# Patient Record
Sex: Female | Born: 1965
Health system: Southern US, Community
[De-identification: ages and names within clinical notes are randomized; demographics above are authoritative.]

---

## 2013-09-10 ENCOUNTER — Other Ambulatory Visit (HOSPITAL_COMMUNITY)
Admission: RE | Admit: 2013-09-10 | Discharge: 2013-09-10 | Disposition: A | Discharge status: Home / Self Care | Payer: BC Managed Care – PPO | Source: Ambulatory Visit | Attending: Family Medicine | Admitting: Family Medicine

## 2013-09-10 DIAGNOSIS — Z124 Encounter for screening for malignant neoplasm of cervix: Secondary | ICD-10-CM | POA: Insufficient documentation

## 2013-09-10 DIAGNOSIS — Z1151 Encounter for screening for human papillomavirus (HPV): Secondary | ICD-10-CM | POA: Insufficient documentation

## 2014-05-03 ENCOUNTER — Other Ambulatory Visit: Payer: Self-pay | Admitting: Family Medicine

## 2014-05-03 DIAGNOSIS — E049 Nontoxic goiter, unspecified: Secondary | ICD-10-CM

## 2014-05-03 DIAGNOSIS — M542 Cervicalgia: Secondary | ICD-10-CM

## 2014-05-03 DIAGNOSIS — R1310 Dysphagia, unspecified: Secondary | ICD-10-CM

## 2014-05-05 ENCOUNTER — Ambulatory Visit
Admission: RE | Admit: 2014-05-05 | Discharge: 2014-05-05 | Disposition: A | Discharge status: Home / Self Care | Payer: BLUE CROSS/BLUE SHIELD | Source: Ambulatory Visit | Attending: Family Medicine | Admitting: Family Medicine

## 2014-05-05 DIAGNOSIS — E049 Nontoxic goiter, unspecified: Secondary | ICD-10-CM

## 2014-05-05 DIAGNOSIS — M542 Cervicalgia: Secondary | ICD-10-CM

## 2014-05-05 DIAGNOSIS — R1310 Dysphagia, unspecified: Secondary | ICD-10-CM

## 2015-10-10 DIAGNOSIS — N63 Unspecified lump in breast: Secondary | ICD-10-CM | POA: Diagnosis not present

## 2015-10-12 DIAGNOSIS — N63 Unspecified lump in breast: Secondary | ICD-10-CM | POA: Diagnosis not present

## 2015-10-16 ENCOUNTER — Other Ambulatory Visit: Payer: Self-pay | Admitting: Radiology

## 2015-10-16 DIAGNOSIS — N63 Unspecified lump in breast: Secondary | ICD-10-CM | POA: Diagnosis not present

## 2015-10-16 DIAGNOSIS — N6012 Diffuse cystic mastopathy of left breast: Secondary | ICD-10-CM | POA: Diagnosis not present

## 2015-10-27 DIAGNOSIS — M542 Cervicalgia: Secondary | ICD-10-CM | POA: Diagnosis not present

## 2015-11-29 DIAGNOSIS — Z23 Encounter for immunization: Secondary | ICD-10-CM | POA: Diagnosis not present

## 2016-01-23 DIAGNOSIS — Z0000 Encounter for general adult medical examination without abnormal findings: Secondary | ICD-10-CM | POA: Diagnosis not present

## 2016-01-23 DIAGNOSIS — Z1159 Encounter for screening for other viral diseases: Secondary | ICD-10-CM | POA: Diagnosis not present

## 2016-01-31 DIAGNOSIS — Z0000 Encounter for general adult medical examination without abnormal findings: Secondary | ICD-10-CM | POA: Diagnosis not present

## 2016-02-12 DIAGNOSIS — F411 Generalized anxiety disorder: Secondary | ICD-10-CM | POA: Diagnosis not present

## 2016-02-21 DIAGNOSIS — F411 Generalized anxiety disorder: Secondary | ICD-10-CM | POA: Diagnosis not present

## 2016-02-28 DIAGNOSIS — F411 Generalized anxiety disorder: Secondary | ICD-10-CM | POA: Diagnosis not present

## 2016-03-05 DIAGNOSIS — F411 Generalized anxiety disorder: Secondary | ICD-10-CM | POA: Diagnosis not present

## 2016-03-26 DIAGNOSIS — I781 Nevus, non-neoplastic: Secondary | ICD-10-CM | POA: Diagnosis not present

## 2016-03-26 DIAGNOSIS — L509 Urticaria, unspecified: Secondary | ICD-10-CM | POA: Diagnosis not present

## 2016-04-08 DIAGNOSIS — Z114 Encounter for screening for human immunodeficiency virus [HIV]: Secondary | ICD-10-CM | POA: Diagnosis not present

## 2016-04-08 DIAGNOSIS — Z1159 Encounter for screening for other viral diseases: Secondary | ICD-10-CM | POA: Diagnosis not present

## 2016-04-08 DIAGNOSIS — Z6823 Body mass index (BMI) 23.0-23.9, adult: Secondary | ICD-10-CM | POA: Diagnosis not present

## 2016-04-08 DIAGNOSIS — R102 Pelvic and perineal pain: Secondary | ICD-10-CM | POA: Diagnosis not present

## 2016-04-08 DIAGNOSIS — B009 Herpesviral infection, unspecified: Secondary | ICD-10-CM | POA: Diagnosis not present

## 2016-04-08 DIAGNOSIS — Z1151 Encounter for screening for human papillomavirus (HPV): Secondary | ICD-10-CM | POA: Diagnosis not present

## 2016-04-08 DIAGNOSIS — Z113 Encounter for screening for infections with a predominantly sexual mode of transmission: Secondary | ICD-10-CM | POA: Diagnosis not present

## 2016-04-08 DIAGNOSIS — Z01411 Encounter for gynecological examination (general) (routine) with abnormal findings: Secondary | ICD-10-CM | POA: Diagnosis not present

## 2016-04-08 DIAGNOSIS — N9411 Superficial (introital) dyspareunia: Secondary | ICD-10-CM | POA: Diagnosis not present

## 2016-04-23 DIAGNOSIS — R928 Other abnormal and inconclusive findings on diagnostic imaging of breast: Secondary | ICD-10-CM | POA: Diagnosis not present

## 2016-04-23 DIAGNOSIS — N6019 Diffuse cystic mastopathy of unspecified breast: Secondary | ICD-10-CM | POA: Diagnosis not present

## 2016-06-20 DIAGNOSIS — N9411 Superficial (introital) dyspareunia: Secondary | ICD-10-CM | POA: Diagnosis not present

## 2016-11-11 DIAGNOSIS — Z1231 Encounter for screening mammogram for malignant neoplasm of breast: Secondary | ICD-10-CM | POA: Diagnosis not present

## 2016-11-28 DIAGNOSIS — Z23 Encounter for immunization: Secondary | ICD-10-CM | POA: Diagnosis not present

## 2017-01-31 DIAGNOSIS — Z23 Encounter for immunization: Secondary | ICD-10-CM | POA: Diagnosis not present

## 2017-01-31 DIAGNOSIS — Z0000 Encounter for general adult medical examination without abnormal findings: Secondary | ICD-10-CM | POA: Diagnosis not present

## 2017-03-29 DIAGNOSIS — J209 Acute bronchitis, unspecified: Secondary | ICD-10-CM | POA: Diagnosis not present

## 2017-03-29 DIAGNOSIS — J22 Unspecified acute lower respiratory infection: Secondary | ICD-10-CM | POA: Diagnosis not present

## 2017-03-29 DIAGNOSIS — R05 Cough: Secondary | ICD-10-CM | POA: Diagnosis not present

## 2017-08-01 DIAGNOSIS — Z01411 Encounter for gynecological examination (general) (routine) with abnormal findings: Secondary | ICD-10-CM | POA: Diagnosis not present

## 2017-08-01 DIAGNOSIS — Z118 Encounter for screening for other infectious and parasitic diseases: Secondary | ICD-10-CM | POA: Diagnosis not present

## 2017-08-01 DIAGNOSIS — N898 Other specified noninflammatory disorders of vagina: Secondary | ICD-10-CM | POA: Diagnosis not present

## 2017-08-01 DIAGNOSIS — N982 Complications of attempted introduction of fertilized ovum following in vitro fertilization: Secondary | ICD-10-CM | POA: Diagnosis not present

## 2017-08-01 DIAGNOSIS — N952 Postmenopausal atrophic vaginitis: Secondary | ICD-10-CM | POA: Diagnosis not present

## 2017-08-01 DIAGNOSIS — Z6823 Body mass index (BMI) 23.0-23.9, adult: Secondary | ICD-10-CM | POA: Diagnosis not present

## 2017-11-13 DIAGNOSIS — Z1231 Encounter for screening mammogram for malignant neoplasm of breast: Secondary | ICD-10-CM | POA: Diagnosis not present

## 2018-02-17 DIAGNOSIS — Z13220 Encounter for screening for lipoid disorders: Secondary | ICD-10-CM | POA: Diagnosis not present

## 2018-02-17 DIAGNOSIS — Z1211 Encounter for screening for malignant neoplasm of colon: Secondary | ICD-10-CM | POA: Diagnosis not present

## 2018-02-17 DIAGNOSIS — Z0000 Encounter for general adult medical examination without abnormal findings: Secondary | ICD-10-CM | POA: Diagnosis not present

## 2018-04-20 ENCOUNTER — Emergency Department (HOSPITAL_COMMUNITY)
Admission: EM | Admit: 2018-04-20 | Discharge: 2018-04-20 | Disposition: A | Discharge status: Home / Self Care | Payer: BLUE CROSS/BLUE SHIELD | Attending: Emergency Medicine | Admitting: Emergency Medicine

## 2018-04-20 ENCOUNTER — Encounter (HOSPITAL_COMMUNITY): Payer: Self-pay | Admitting: *Deleted

## 2018-04-20 ENCOUNTER — Emergency Department (HOSPITAL_COMMUNITY): Payer: BLUE CROSS/BLUE SHIELD

## 2018-04-20 DIAGNOSIS — M25551 Pain in right hip: Secondary | ICD-10-CM | POA: Diagnosis not present

## 2018-04-20 DIAGNOSIS — I10 Essential (primary) hypertension: Secondary | ICD-10-CM | POA: Diagnosis not present

## 2018-04-20 DIAGNOSIS — R52 Pain, unspecified: Secondary | ICD-10-CM | POA: Diagnosis not present

## 2018-04-20 DIAGNOSIS — M5416 Radiculopathy, lumbar region: Secondary | ICD-10-CM | POA: Diagnosis not present

## 2018-04-20 DIAGNOSIS — G5701 Lesion of sciatic nerve, right lower limb: Secondary | ICD-10-CM | POA: Diagnosis not present

## 2018-04-20 DIAGNOSIS — R531 Weakness: Secondary | ICD-10-CM | POA: Diagnosis not present

## 2018-04-20 DIAGNOSIS — M545 Low back pain: Secondary | ICD-10-CM | POA: Diagnosis not present

## 2018-04-20 DIAGNOSIS — Z79899 Other long term (current) drug therapy: Secondary | ICD-10-CM | POA: Insufficient documentation

## 2018-04-20 DIAGNOSIS — M5410 Radiculopathy, site unspecified: Secondary | ICD-10-CM

## 2018-04-20 MED ORDER — DIAZEPAM 5 MG PO TABS
5.0000 mg | ORAL_TABLET | Freq: Once | ORAL | Status: AC
  Administered 2018-04-20: 5 mg via ORAL
  Filled 2018-04-20: qty 1

## 2018-04-20 MED ORDER — MORPHINE SULFATE 15 MG PO TABS: 15.0000 mg | ORAL_TABLET | ORAL | 0 refills | Status: AC | PRN

## 2018-04-20 MED ORDER — KETOROLAC TROMETHAMINE 60 MG/2ML IM SOLN
15.0000 mg | Freq: Once | INTRAMUSCULAR | Status: AC
  Administered 2018-04-20: 15 mg via INTRAMUSCULAR
  Filled 2018-04-20: qty 2

## 2018-04-20 MED ORDER — MORPHINE SULFATE 15 MG PO TABS: 15.0000 mg | ORAL_TABLET | ORAL | 0 refills | Status: DC | PRN

## 2018-04-20 MED ORDER — DIAZEPAM 5 MG PO TABS: 5.0000 mg | ORAL_TABLET | Freq: Four times a day (QID) | ORAL | 0 refills | Status: AC | PRN

## 2018-04-20 MED ORDER — ACETAMINOPHEN 500 MG PO TABS
1000.0000 mg | ORAL_TABLET | Freq: Once | ORAL | Status: AC
  Administered 2018-04-20: 1000 mg via ORAL
  Filled 2018-04-20: qty 2

## 2018-04-20 MED ORDER — MORPHINE SULFATE (PF) 4 MG/ML IV SOLN
4.0000 mg | Freq: Once | INTRAVENOUS | Status: AC
  Administered 2018-04-20: 4 mg via INTRAMUSCULAR
  Filled 2018-04-20: qty 1

## 2018-04-20 NOTE — Discharge Instructions (Signed)
Take 4 over the counter ibuprofen tablets 3 times a day or 2 over-the-counter naproxen tablets twice a day for pain. Also take tylenol 1000mg (2 extra strength) four times a day.   Then take the pain medicine if you feel like you need it. Narcotics do not help with the pain, they only make you care about it less.  You can become addicted to this, people may break into your house to steal it.  It will constipate you.  If you drive under the influence of this medicine you can get a DUI.    Return for fever sudden worsening pain numbness or weakness to the leg difficulty urinating or inability to urinate if you poop on yourself or if he cannot feel your bottom when you wipe with toilet paper.

## 2018-04-20 NOTE — ED Provider Notes (Signed)
[facility]-EMERGENCY DEPT Provider Note   CSN: [id number] Arrival date & time: 04/20/18  0911     History   Chief Complaint Chief Complaint  Patient presents with  . Hip Pain    HPI Jill Gilbert is a 53 y.o. female.  52 yo F with a chief complaint of right leg pain that radiates from the buttock down to the anterior aspect of the shin.  Describes this as a crampy pain.  Worse with sitting and standing.  To the point where she is unable to stand or walk.  She denies fevers or chills denies trauma.  She did personal trainer session a day before the onset of the pain.  She denies loss of bowel or bladder denies loss perirectal sensation denies abdominal pain.  The history is provided by the patient.  Hip Pain  This is a new problem. The current episode started less than 1 hour ago. The problem occurs constantly. The problem has not changed since onset.Pertinent negatives include no chest pain, no headaches and no shortness of breath. Nothing aggravates the symptoms. Nothing relieves the symptoms. She has tried nothing for the symptoms. The treatment provided no relief.    History reviewed. No pertinent past medical history.  There are no active problems to display for this patient.   History reviewed. No pertinent surgical history.   OB History   No obstetric history on file.      Home Medications    Prior to Admission medications   Medication Sig Start Date End Date Taking? Authorizing Provider  b complex vitamins tablet Take 1 tablet by mouth daily.   Yes [provider]  cetirizine (ZYRTEC) 10 MG tablet Take 10 mg by mouth daily.   Yes [provider]  Misc Natural Products (TART CHERRY ADVANCED PO) Take 1 tablet by mouth daily.   Yes [provider]  naproxen sodium (ALEVE) 220 MG tablet Take 220 mg by mouth daily as needed (pain).   Yes [provider]  TURMERIC PO Take 1 capsule by mouth daily.   Yes  [provider]  Vitamin D, Cholecalciferol, 50 MCG (2000 UT) CAPS Take 1 tablet by mouth daily.   Yes [provider]  diazepam (VALIUM) 5 MG tablet Take 1 tablet (5 mg total) by mouth every 6 (six) hours as needed for anxiety (spasms). 04/20/18   Melene Plan, DO  morphine (MSIR) 15 MG tablet Take 1 tablet (15 mg total) by mouth every 4 (four) hours as needed for severe pain. 04/20/18   Melene Plan, DO    Family History No family history on file.  Social History Social History   Tobacco Use  . Smoking status: Never Smoker  . Smokeless tobacco: Never Used  Substance Use Topics  . Alcohol use: Yes  . Drug use: Not on file     Allergies   Sulfa antibiotics and Vistaril [hydroxyzine hcl]   Review of Systems Review of Systems  Constitutional: Negative for chills and fever.  HENT: Negative for congestion and rhinorrhea.   Eyes: Negative for redness and visual disturbance.  Respiratory: Negative for shortness of breath and wheezing.   Cardiovascular: Negative for chest pain and palpitations.  Gastrointestinal: Negative for nausea and vomiting.  Genitourinary: Negative for dysuria and urgency.  Musculoskeletal: Positive for arthralgias, back pain and myalgias.  Skin: Negative for pallor and wound.  Neurological: Negative for dizziness and headaches.     Physical Exam Updated Vital Signs BP (!) 147/83 (BP  Location: Right Arm)   Pulse 69   Temp 97.9 F (36.6 C) (Oral)   Resp 18   SpO2 100%   Physical Exam Vitals signs and nursing note reviewed.  Constitutional:      General: She is not in acute distress.    Appearance: She is well-developed. She is not diaphoretic.  HENT:     Head: Normocephalic and atraumatic.  Eyes:     Pupils: Pupils are equal, round, and reactive to light.  Neck:     Musculoskeletal: Normal range of motion and neck supple.  Cardiovascular:     Rate and Rhythm: Normal rate and regular rhythm.     Heart sounds: No murmur. No  friction rub. No gallop.   Pulmonary:     Effort: Pulmonary effort is normal.     Breath sounds: No wheezing or rales.  Abdominal:     General: There is no distension.     Palpations: Abdomen is soft.     Tenderness: There is abdominal tenderness (suprapubic, and diffuse lower).     Comments: Palpable bladder  Musculoskeletal:        General: Tenderness present.     Comments: Tenderness about the right piriformis.  No midline spinal tenderness.  Pulse motor and sensation is intact distally.  No noted clonus, reflexes are 2+ and equal.  Skin:    General: Skin is warm and dry.  Neurological:     Mental Status: She is alert and oriented to person, place, and time.  Psychiatric:        Behavior: Behavior normal.      ED Treatments / Results  Labs (all labs ordered are listed, but only abnormal results are displayed) Labs Reviewed - No data to display  EKG None  Radiology Dg Lumbar Spine Complete  Result Date: 04/20/2018 CLINICAL DATA:  Intermittent low back pain with leg weakness since working out 4 days ago. EXAM: LUMBAR SPINE - COMPLETE 4+ VIEW COMPARISON:  None. FINDINGS: Vertebral body alignment, heights and disc space heights are normal. Minimal spondylosis of the lumbar spine. No evidence of compression fracture or spondylolisthesis. IMPRESSION: No acute findings. Minimal spondylosis. Electronically Signed   By: Elberta Fortis M.D.   On: 04/20/2018 10:55   Dg Hip Unilat W Or Wo Pelvis 2-3 Views Right  Result Date: 04/20/2018 CLINICAL DATA:  Intermittent low back pain and leg weakness is working out 4 days ago. EXAM: DG HIP (WITH OR WITHOUT PELVIS) 2-3V RIGHT COMPARISON:  None. FINDINGS: Bones, joint spaces and soft tissues of the hips are normal. No evidence of fracture or dislocation. No significant degenerative change. IMPRESSION: No acute findings. Electronically Signed   By: Elberta Fortis M.D.   On: 04/20/2018 10:56    Procedures Procedures (including critical care  time)  Medications Ordered in ED Medications  morphine 4 MG/ML injection 4 mg (4 mg Intramuscular Given 04/20/18 1017)  ketorolac (TORADOL) injection 15 mg (15 mg Intramuscular Given 04/20/18 1013)  acetaminophen (TYLENOL) tablet 1,000 mg (1,000 mg Oral Given 04/20/18 1013)  diazepam (VALIUM) tablet 5 mg (5 mg Oral Given 04/20/18 1013)     Initial Impression / Assessment and Plan / ED Course  I have reviewed the triage vital signs and the nursing notes.  Pertinent labs & imaging results that were available during my care of the patient were reviewed by me and considered in my medical decision making (see chart for details).     53 yo F with a chief complaint of right-sided  low back pain.  Clinically the patient has piriformis syndrome, she however is in a severe amount of pain.  We will try and treat her pain here screening plain films.  She is having some lower abdominal tenderness that she states is due to her needing to use the bathroom.  She denies any issues actually going but has been having so much pain is hard for her to get up and onto a toilet.  Screening plain films are negative for fracture as viewed by me.  Patient is feeling better on reassessment and she is able to ambulate so on her own.  Still having a significant amount of pain.  We will have the patient use Tylenol and ibuprofen will send home with a short course of pain medicine and muscle relaxants, PCP follow-up. [place] drug database without prior narcotic script.   11:29 AM:  I have discussed the diagnosis/risks/treatment options with the patient and family and believe the pt to be eligible for discharge home to follow-up with PCP. We also discussed returning to the ED immediately if new or worsening sx occur. We discussed the sx which are most concerning (e.g., sudden worsening pain, fever, cauda equina s/sx) that necessitate immediate return. Medications administered to the patient during their visit and any new prescriptions  provided to the patient are listed below.  Medications given during this visit Medications  morphine 4 MG/ML injection 4 mg (4 mg Intramuscular Given 04/20/18 1017)  ketorolac (TORADOL) injection 15 mg (15 mg Intramuscular Given 04/20/18 1013)  acetaminophen (TYLENOL) tablet 1,000 mg (1,000 mg Oral Given 04/20/18 1013)  diazepam (VALIUM) tablet 5 mg (5 mg Oral Given 04/20/18 1013)     The patient appears reasonably screen and/or stabilized for discharge and I doubt any other medical condition or other [facility] requiring further screening, evaluation, or treatment in the ED at this time prior to discharge.    Final Clinical Impressions(s) / ED Diagnoses   Final diagnoses:  Radiculopathy of leg  Piriformis syndrome of right side    ED Discharge Orders         Ordered    morphine (MSIR) 15 MG tablet  Every 4 hours PRN     04/20/18 1127    diazepam (VALIUM) 5 MG tablet  Every 6 hours PRN     04/20/18 1127           Melene PlanFloyd, Denzal Meir, DO 04/20/18 1129

## 2018-04-20 NOTE — ED Notes (Signed)
Bed: WA21 Expected date:  Expected time:  Means of arrival:  Comments: EMS  

## 2018-04-20 NOTE — ED Triage Notes (Signed)
Per EMS, pt complains of worsening right hip pain x 2 days. Pt states she was unable to walk since yesterday. Pt has been doing strenuous workouts with a trainer recently. Pt denies injury.  BP 140/80 HR 80 SpO2 100% RR 18

## 2018-04-23 DIAGNOSIS — M5416 Radiculopathy, lumbar region: Secondary | ICD-10-CM | POA: Diagnosis not present

## 2018-05-02 DIAGNOSIS — G64 Other disorders of peripheral nervous system: Secondary | ICD-10-CM | POA: Diagnosis not present

## 2018-05-02 DIAGNOSIS — M7631 Iliotibial band syndrome, right leg: Secondary | ICD-10-CM | POA: Diagnosis not present

## 2018-05-02 DIAGNOSIS — M545 Low back pain: Secondary | ICD-10-CM | POA: Diagnosis not present

## 2018-07-20 DIAGNOSIS — Z03818 Encounter for observation for suspected exposure to other biological agents ruled out: Secondary | ICD-10-CM | POA: Diagnosis not present

## 2018-10-01 DIAGNOSIS — Z20828 Contact with and (suspected) exposure to other viral communicable diseases: Secondary | ICD-10-CM | POA: Diagnosis not present

## 2018-11-10 DIAGNOSIS — Z6824 Body mass index (BMI) 24.0-24.9, adult: Secondary | ICD-10-CM | POA: Diagnosis not present

## 2018-11-10 DIAGNOSIS — N898 Other specified noninflammatory disorders of vagina: Secondary | ICD-10-CM | POA: Diagnosis not present

## 2018-11-10 DIAGNOSIS — Z01419 Encounter for gynecological examination (general) (routine) without abnormal findings: Secondary | ICD-10-CM | POA: Diagnosis not present

## 2018-11-10 DIAGNOSIS — B373 Candidiasis of vulva and vagina: Secondary | ICD-10-CM | POA: Diagnosis not present

## 2018-12-11 DIAGNOSIS — Z1231 Encounter for screening mammogram for malignant neoplasm of breast: Secondary | ICD-10-CM | POA: Diagnosis not present

## 2019-03-07 DIAGNOSIS — Z20828 Contact with and (suspected) exposure to other viral communicable diseases: Secondary | ICD-10-CM | POA: Diagnosis not present

## 2019-06-12 ENCOUNTER — Ambulatory Visit: Payer: BC Managed Care – PPO | Attending: Internal Medicine

## 2019-06-12 DIAGNOSIS — Z23 Encounter for immunization: Secondary | ICD-10-CM

## 2019-06-12 NOTE — Progress Notes (Signed)
   Covid-19 Vaccination Clinic  Name:  Jill Gilbert    MRN: [id number] DOB: 06-07-1965  06/12/2019  [name] was observed post Covid-19 immunization for 15 minutes without incident. She was provided with Vaccine Information Sheet and instruction to access the V-Safe system.   [name] was instructed to call 911 with any severe reactions post vaccine: Marland Kitchen Difficulty breathing  . Swelling of face and throat  . A fast heartbeat  . A bad rash all over body  . Dizziness and weakness   Immunizations Administered    Name Date Dose VIS Date Route   Pfizer COVID-19 Vaccine 06/12/2019  3:16 PM 0.3 mL 02/12/2019 Intramuscular   Manufacturer: [employer], [employer]   Lot: [id number]   NDC: [address]-1

## 2019-07-10 ENCOUNTER — Ambulatory Visit: Payer: BC Managed Care – PPO | Attending: Internal Medicine

## 2019-07-10 DIAGNOSIS — Z23 Encounter for immunization: Secondary | ICD-10-CM

## 2019-07-10 NOTE — Progress Notes (Signed)
   Covid-19 Vaccination Clinic  Name:  Almetta Liddicoat    MRN: [id number] DOB: [birth year]  07/10/2019  Ms. Strick was observed post Covid-19 immunization for 15 minutes without incident. She was provided with Vaccine Information Sheet and instruction to access the V-Safe system.   Ms. Yearsley was instructed to call 911 with any severe reactions post vaccine: Marland Kitchen Difficulty breathing  . Swelling of face and throat  . A fast heartbeat  . A bad rash all over body  . Dizziness and weakness   Immunizations Administered    Name Date Dose VIS Date Route   Pfizer COVID-19 Vaccine 07/10/2019  8:13 AM 0.3 mL 04/28/2018 Intramuscular   Manufacturer: [employer], [employer]   Lot: [id number]   NDC: [address]-0

## 2019-09-01 DIAGNOSIS — Z13220 Encounter for screening for lipoid disorders: Secondary | ICD-10-CM | POA: Diagnosis not present

## 2019-09-01 DIAGNOSIS — E042 Nontoxic multinodular goiter: Secondary | ICD-10-CM | POA: Diagnosis not present

## 2019-09-01 DIAGNOSIS — Z0000 Encounter for general adult medical examination without abnormal findings: Secondary | ICD-10-CM | POA: Diagnosis not present

## 2019-09-01 DIAGNOSIS — Z807 Family history of other malignant neoplasms of lymphoid, hematopoietic and related tissues: Secondary | ICD-10-CM | POA: Diagnosis not present

## 2019-09-01 DIAGNOSIS — M47812 Spondylosis without myelopathy or radiculopathy, cervical region: Secondary | ICD-10-CM | POA: Diagnosis not present

## 2019-09-15 DIAGNOSIS — Z780 Asymptomatic menopausal state: Secondary | ICD-10-CM | POA: Diagnosis not present

## 2019-09-15 DIAGNOSIS — M8589 Other specified disorders of bone density and structure, multiple sites: Secondary | ICD-10-CM | POA: Diagnosis not present

## 2019-09-18 IMAGING — CR DG HIP (WITH OR WITHOUT PELVIS) 2-3V*R*
3 series · 3 of 3 positions shown · non-contrast
Comparison: None.

CLINICAL DATA: Intermittent low back pain and leg weakness is
working [DATE] days ago.

EXAM:
DG HIP (WITH OR WITHOUT PELVIS) 2-3V RIGHT

[t pelvis ap]
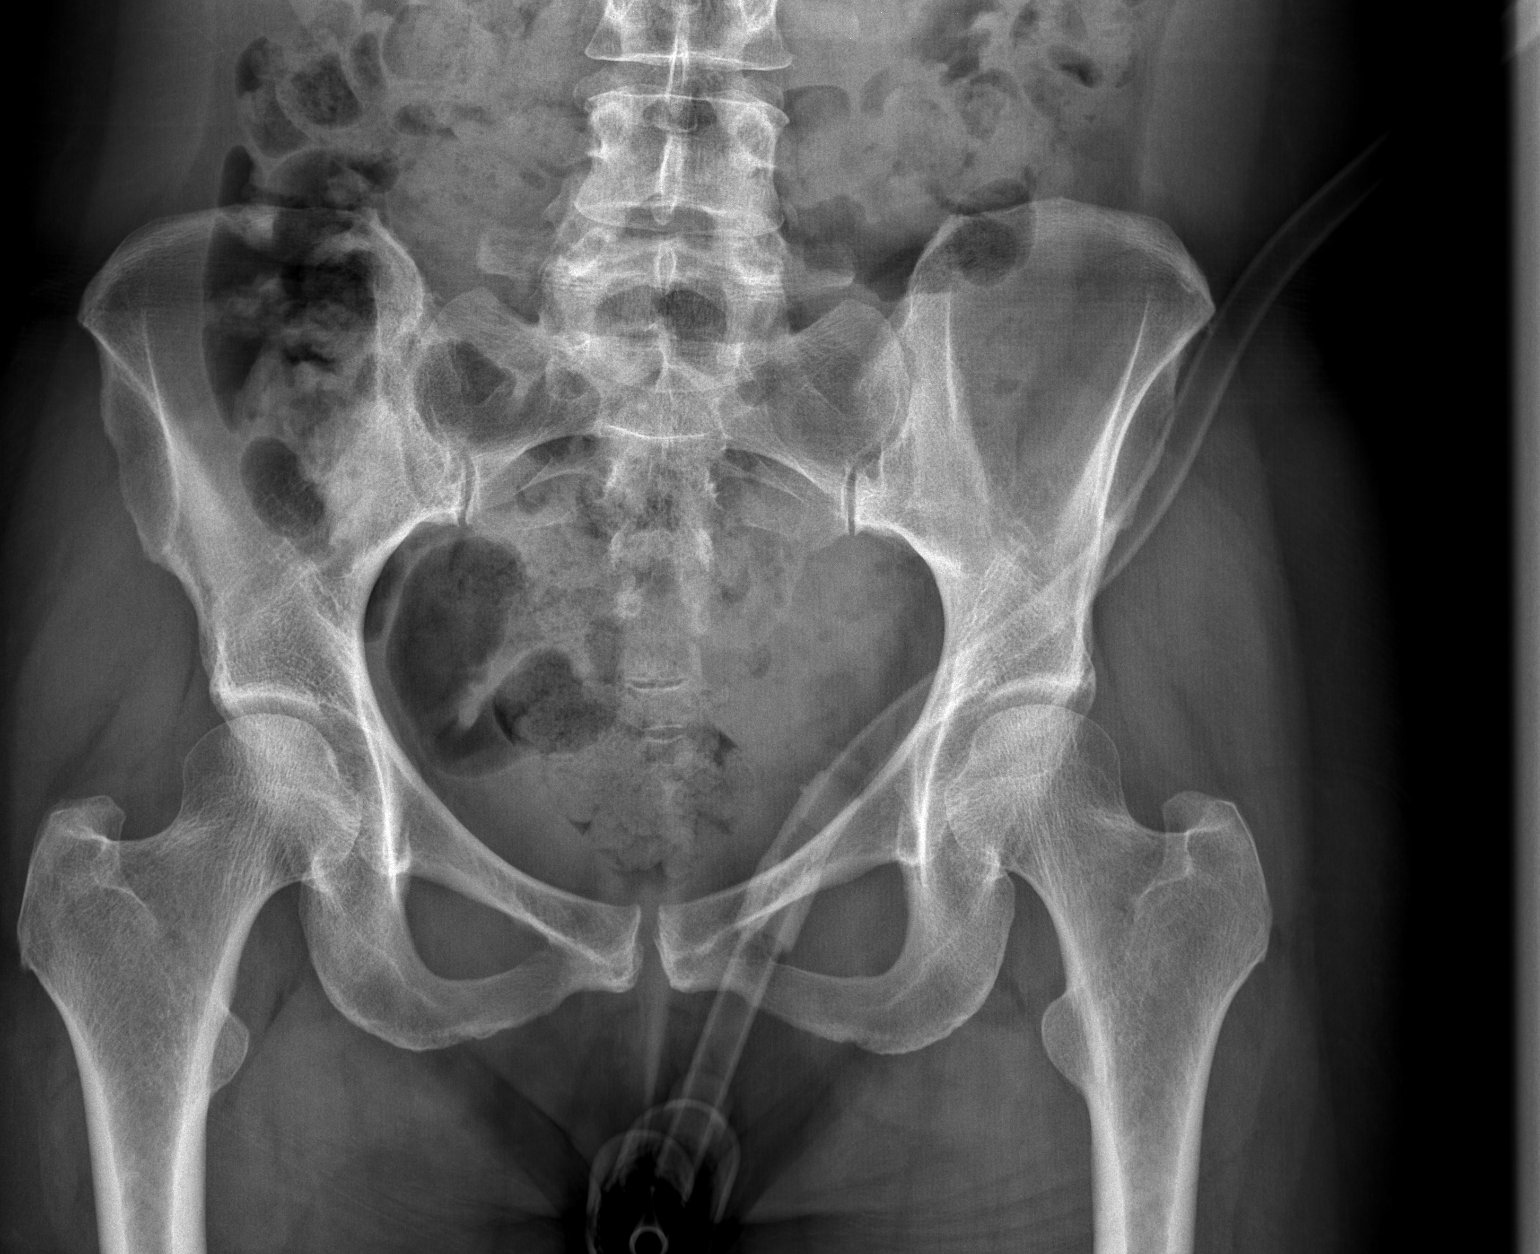

[t hip ap right]
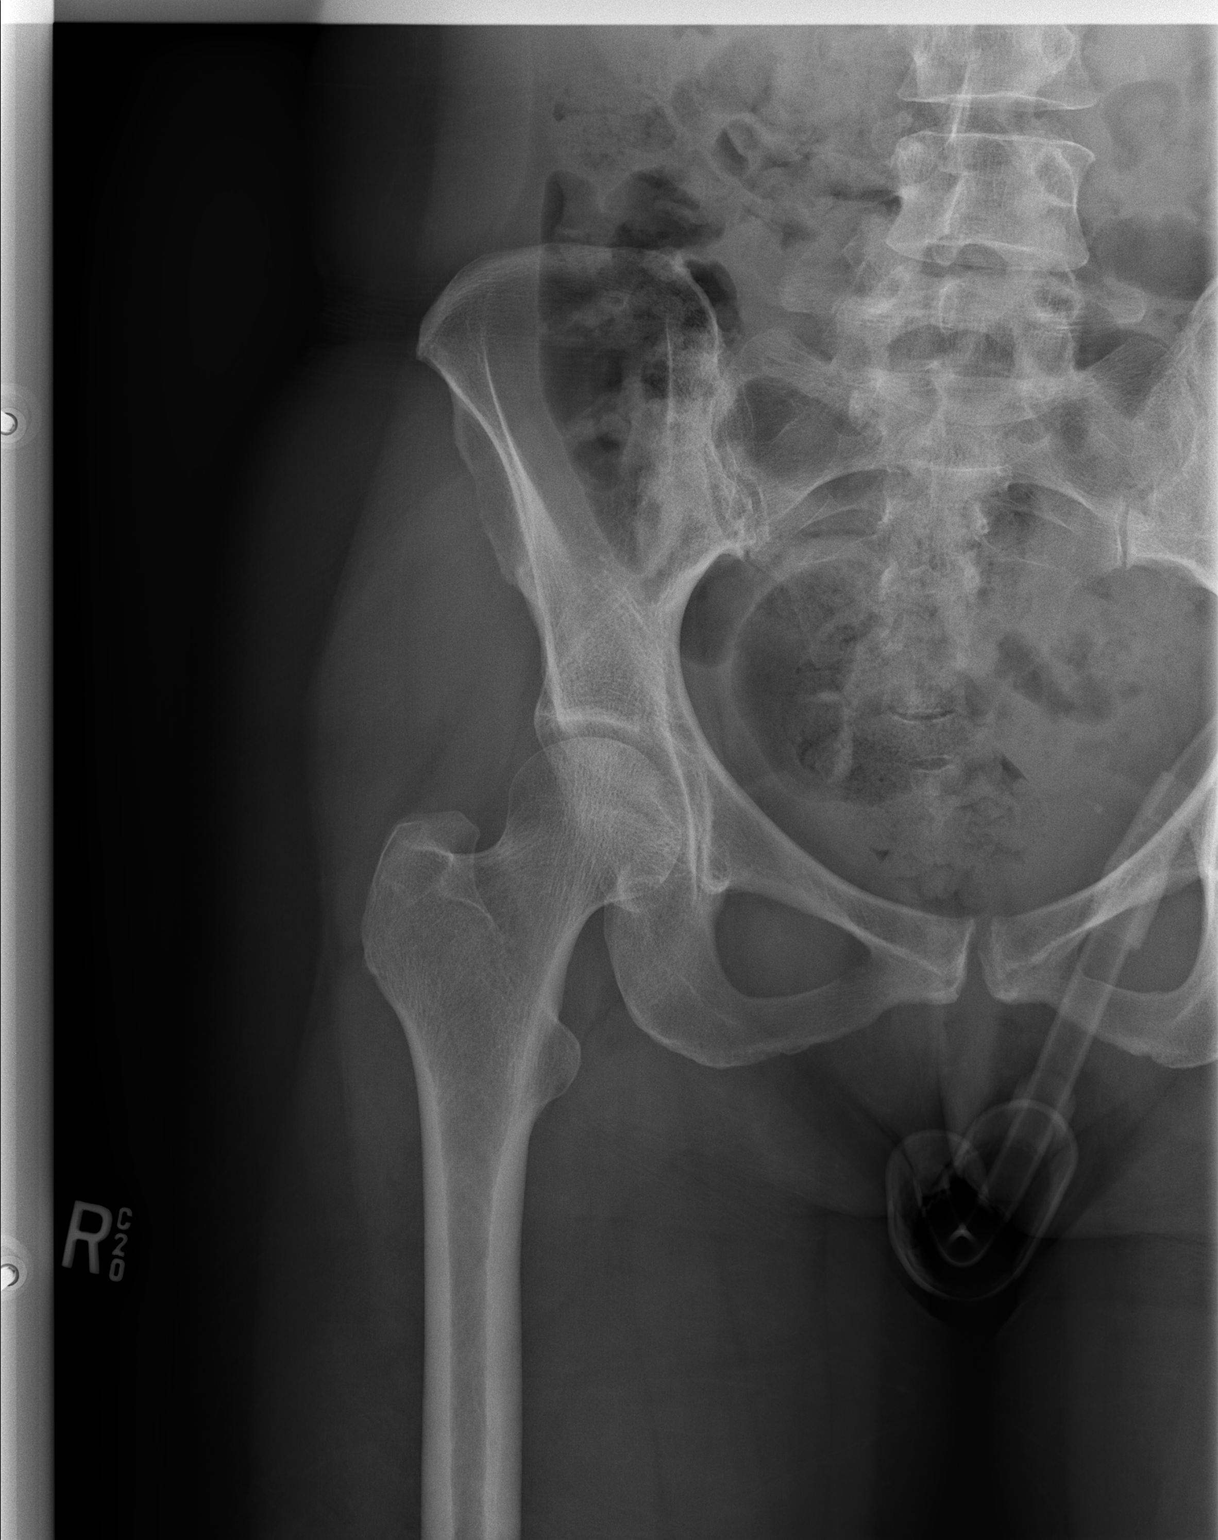

[t hip frog leg right]
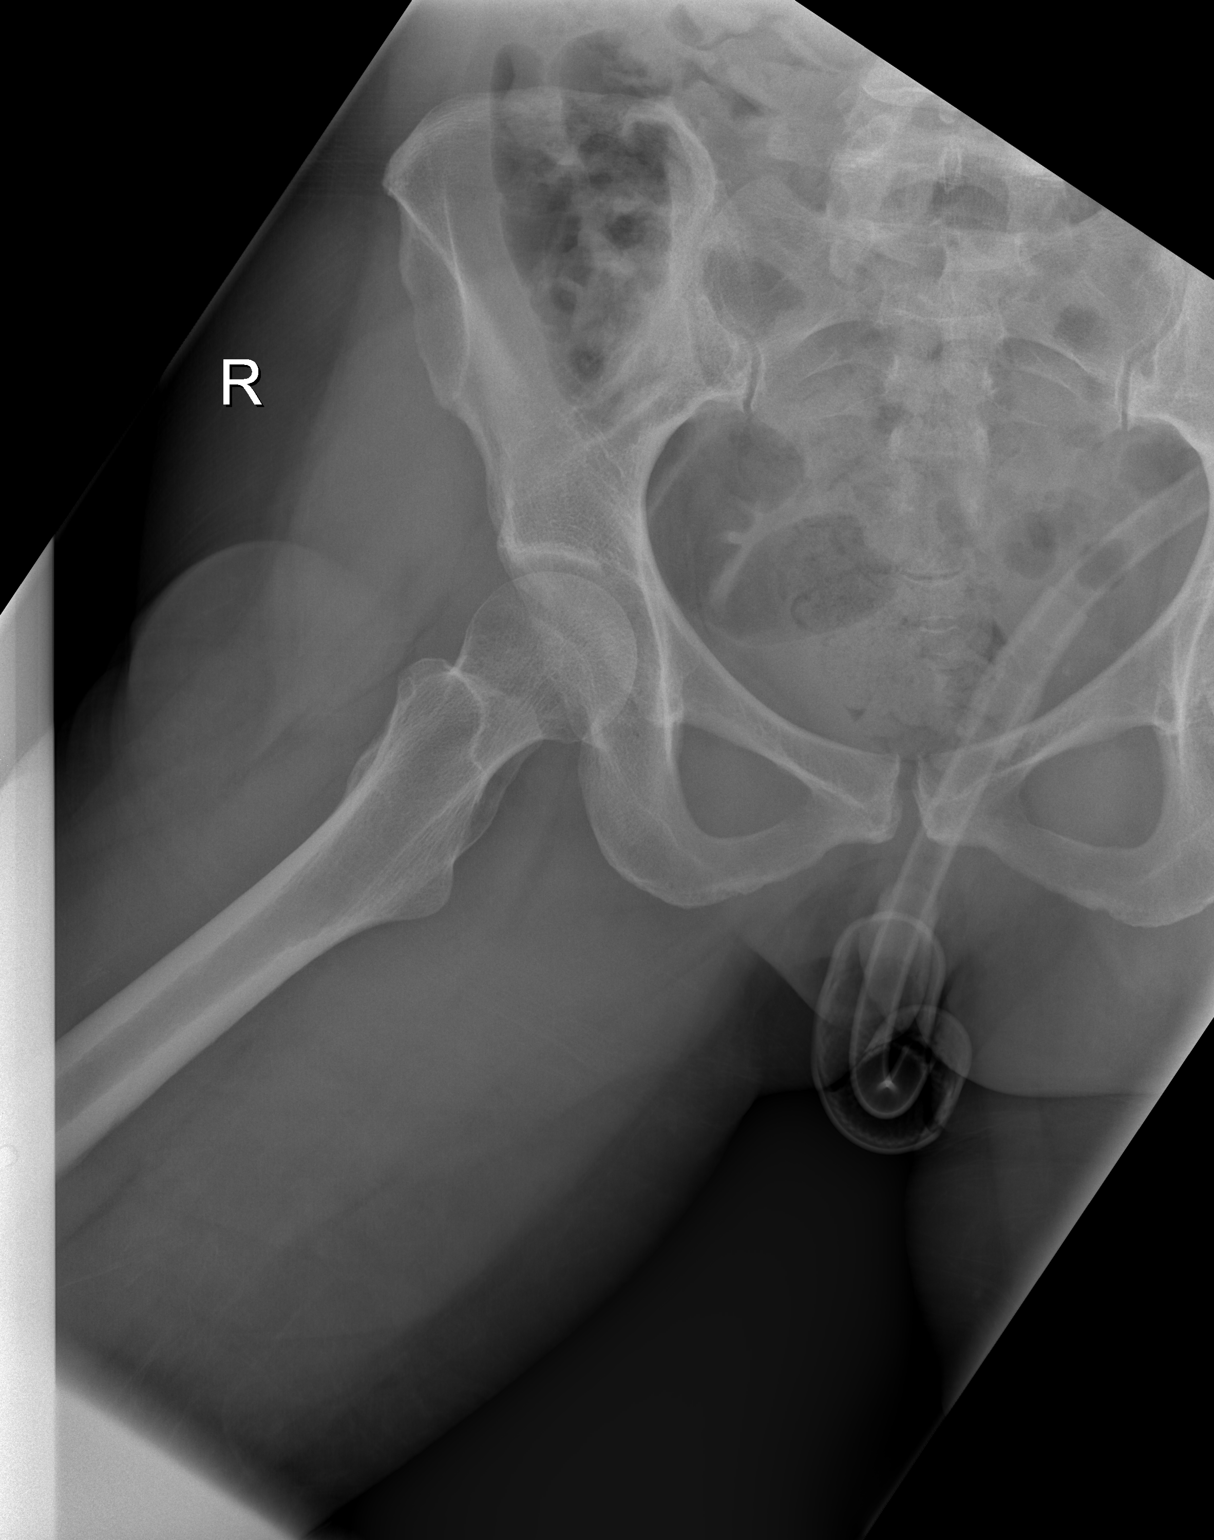

[3 of 3 positions shown; findings below may reference images not displayed]

FINDINGS: Bones, joint spaces and soft tissues of the hips are normal. No
evidence of fracture or dislocation. No significant degenerative
change.
IMPRESSION: No acute findings.

## 2019-09-18 IMAGING — CR DG LUMBAR SPINE COMPLETE 4+V
5 series · 5 of 5 positions shown · non-contrast
Comparison: None.

CLINICAL DATA: Intermittent low back pain with leg weakness since
working [DATE] days ago.

EXAM:
LUMBAR SPINE - COMPLETE 4+ VIEW

[t lumbar spine lat]
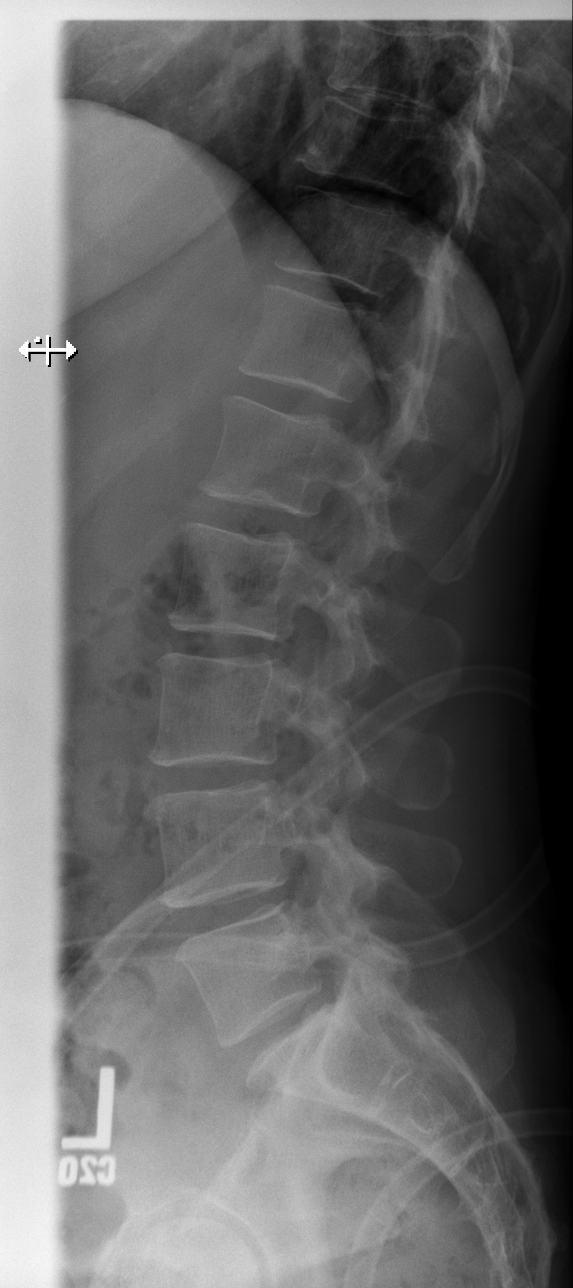

[t lumbar l-5 s-1 spot]
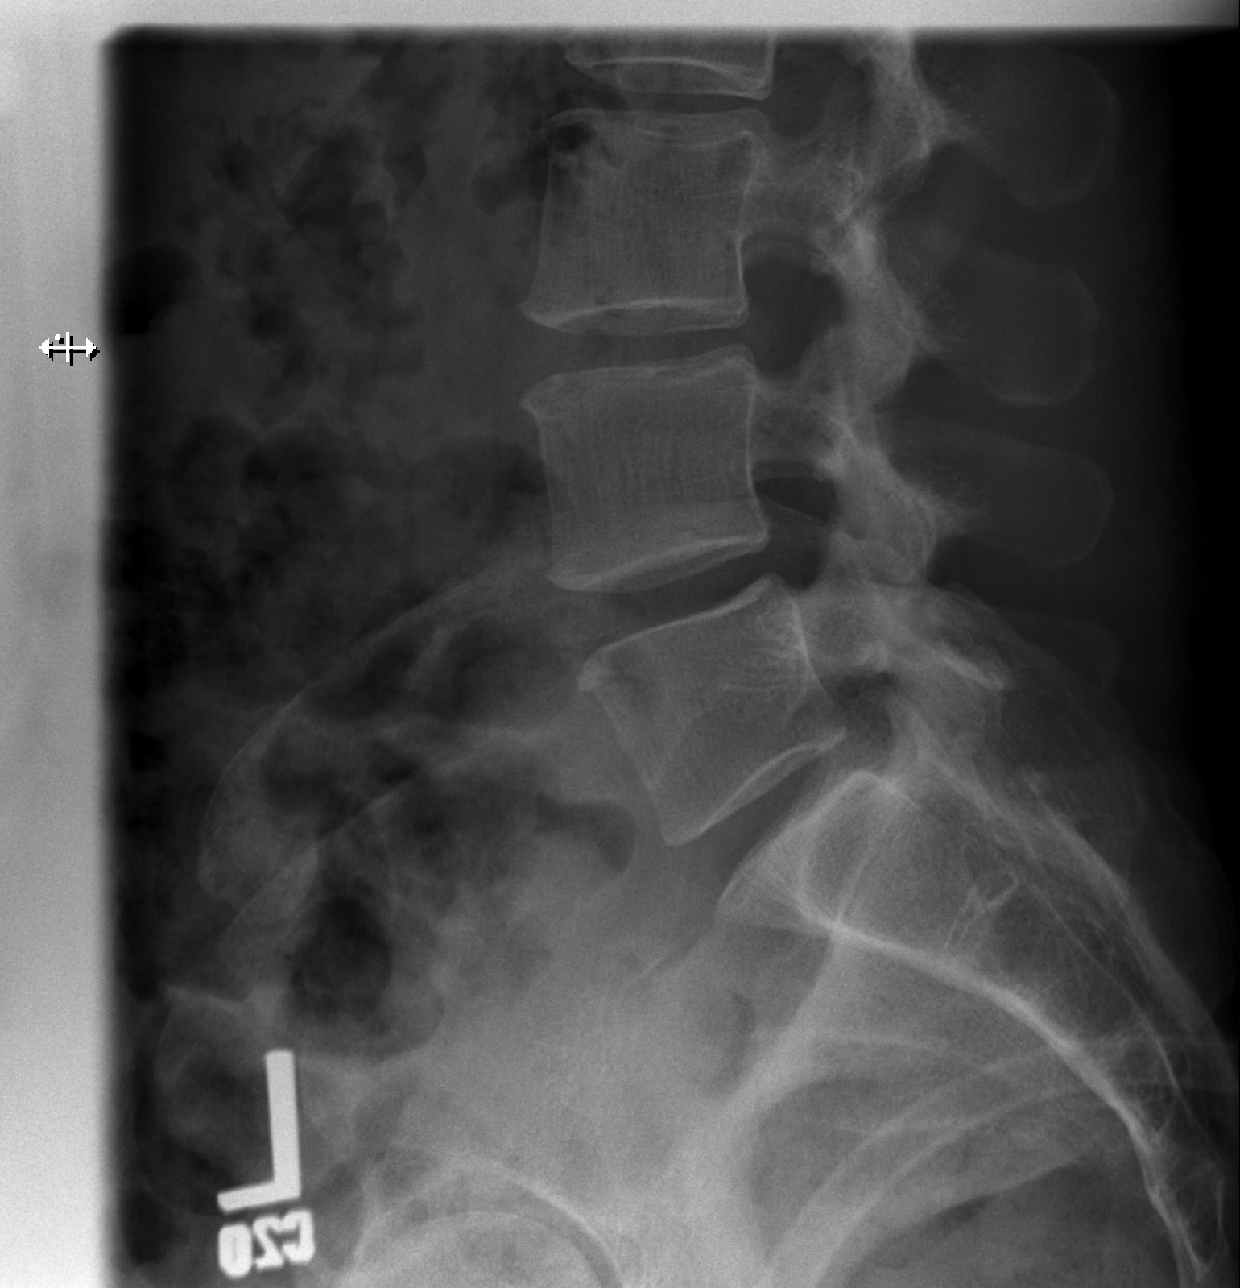

[t lumbar spine obl (1 of 2)]
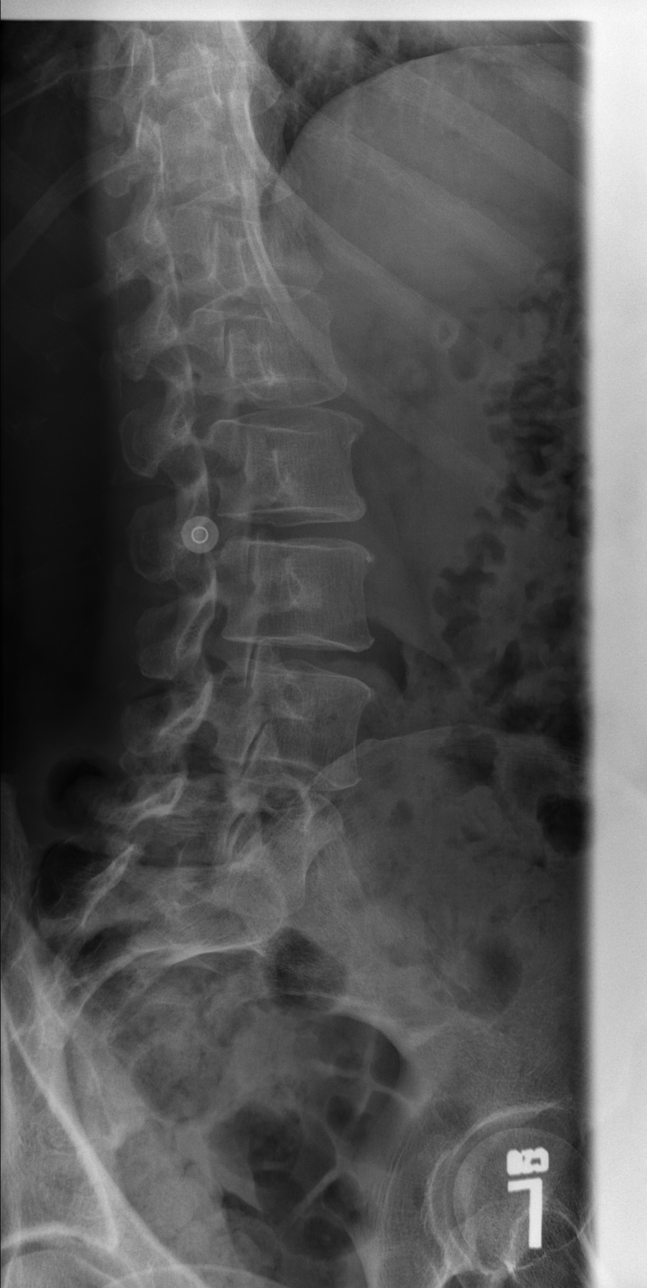

[t lumbar spine ap]
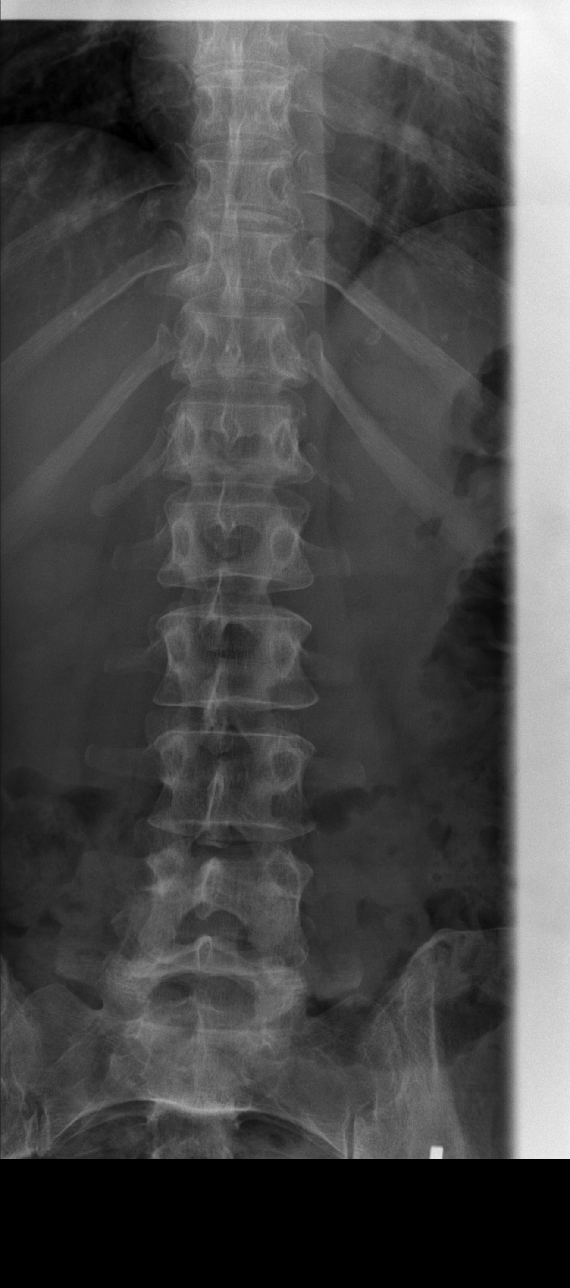

[t lumbar spine obl (2 of 2)]
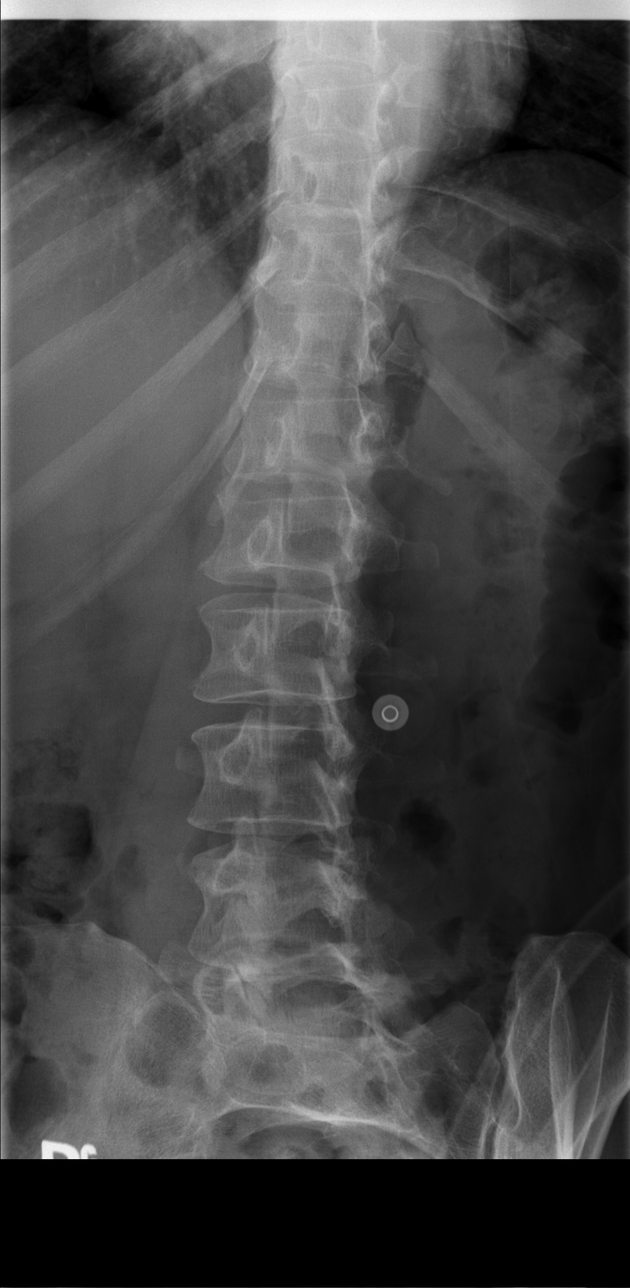

[5 of 5 positions shown; findings below may reference images not displayed]

FINDINGS: Vertebral body alignment, heights and disc space heights are normal.
Minimal spondylosis of the lumbar spine. No evidence of compression
fracture or spondylolisthesis.
IMPRESSION: No acute findings.

Minimal spondylosis.

## 2019-12-09 DIAGNOSIS — Z23 Encounter for immunization: Secondary | ICD-10-CM | POA: Diagnosis not present

## 2019-12-09 DIAGNOSIS — F320 Major depressive disorder, single episode, mild: Secondary | ICD-10-CM | POA: Diagnosis not present

## 2019-12-09 DIAGNOSIS — Z6825 Body mass index (BMI) 25.0-25.9, adult: Secondary | ICD-10-CM | POA: Diagnosis not present

## 2019-12-17 DIAGNOSIS — Z1231 Encounter for screening mammogram for malignant neoplasm of breast: Secondary | ICD-10-CM | POA: Diagnosis not present

## 2020-01-21 DIAGNOSIS — F320 Major depressive disorder, single episode, mild: Secondary | ICD-10-CM | POA: Diagnosis not present

## 2020-07-06 DIAGNOSIS — L989 Disorder of the skin and subcutaneous tissue, unspecified: Secondary | ICD-10-CM | POA: Diagnosis not present

## 2020-07-23 DIAGNOSIS — I8002 Phlebitis and thrombophlebitis of superficial vessels of left lower extremity: Secondary | ICD-10-CM | POA: Diagnosis not present

## 2020-07-24 DIAGNOSIS — D485 Neoplasm of uncertain behavior of skin: Secondary | ICD-10-CM | POA: Diagnosis not present

## 2020-07-24 DIAGNOSIS — L570 Actinic keratosis: Secondary | ICD-10-CM | POA: Diagnosis not present

## 2020-09-13 DIAGNOSIS — F320 Major depressive disorder, single episode, mild: Secondary | ICD-10-CM | POA: Diagnosis not present

## 2020-09-13 DIAGNOSIS — M8580 Other specified disorders of bone density and structure, unspecified site: Secondary | ICD-10-CM | POA: Diagnosis not present

## 2020-09-13 DIAGNOSIS — Z0000 Encounter for general adult medical examination without abnormal findings: Secondary | ICD-10-CM | POA: Diagnosis not present

## 2020-09-13 DIAGNOSIS — Z13220 Encounter for screening for lipoid disorders: Secondary | ICD-10-CM | POA: Diagnosis not present

## 2020-09-13 DIAGNOSIS — R195 Other fecal abnormalities: Secondary | ICD-10-CM | POA: Diagnosis not present

## 2020-09-13 DIAGNOSIS — R739 Hyperglycemia, unspecified: Secondary | ICD-10-CM | POA: Diagnosis not present

## 2020-09-13 DIAGNOSIS — E042 Nontoxic multinodular goiter: Secondary | ICD-10-CM | POA: Diagnosis not present

## 2020-09-18 DIAGNOSIS — R197 Diarrhea, unspecified: Secondary | ICD-10-CM | POA: Diagnosis not present

## 2020-12-11 DIAGNOSIS — R197 Diarrhea, unspecified: Secondary | ICD-10-CM | POA: Diagnosis not present

## 2020-12-22 DIAGNOSIS — Z1231 Encounter for screening mammogram for malignant neoplasm of breast: Secondary | ICD-10-CM | POA: Diagnosis not present

## 2021-03-20 DIAGNOSIS — H524 Presbyopia: Secondary | ICD-10-CM | POA: Diagnosis not present

## 2021-03-20 DIAGNOSIS — H5213 Myopia, bilateral: Secondary | ICD-10-CM | POA: Diagnosis not present

## 2021-03-20 DIAGNOSIS — H52223 Regular astigmatism, bilateral: Secondary | ICD-10-CM | POA: Diagnosis not present

## 2021-12-25 DIAGNOSIS — Z1231 Encounter for screening mammogram for malignant neoplasm of breast: Secondary | ICD-10-CM | POA: Diagnosis not present

## 2021-12-27 DIAGNOSIS — Z13220 Encounter for screening for lipoid disorders: Secondary | ICD-10-CM | POA: Diagnosis not present

## 2021-12-27 DIAGNOSIS — Z0000 Encounter for general adult medical examination without abnormal findings: Secondary | ICD-10-CM | POA: Diagnosis not present

## 2022-01-17 DIAGNOSIS — M8589 Other specified disorders of bone density and structure, multiple sites: Secondary | ICD-10-CM | POA: Diagnosis not present

## 2022-01-17 DIAGNOSIS — Z780 Asymptomatic menopausal state: Secondary | ICD-10-CM | POA: Diagnosis not present

## 2022-02-14 DIAGNOSIS — Z6826 Body mass index (BMI) 26.0-26.9, adult: Secondary | ICD-10-CM | POA: Diagnosis not present

## 2022-02-14 DIAGNOSIS — A609 Anogenital herpesviral infection, unspecified: Secondary | ICD-10-CM | POA: Diagnosis not present

## 2022-02-14 DIAGNOSIS — N941 Unspecified dyspareunia: Secondary | ICD-10-CM | POA: Diagnosis not present

## 2022-02-14 DIAGNOSIS — N952 Postmenopausal atrophic vaginitis: Secondary | ICD-10-CM | POA: Diagnosis not present

## 2022-02-14 DIAGNOSIS — Z01419 Encounter for gynecological examination (general) (routine) without abnormal findings: Secondary | ICD-10-CM | POA: Diagnosis not present

## 2022-02-14 DIAGNOSIS — Z124 Encounter for screening for malignant neoplasm of cervix: Secondary | ICD-10-CM | POA: Diagnosis not present

## 2022-12-31 DIAGNOSIS — Z1231 Encounter for screening mammogram for malignant neoplasm of breast: Secondary | ICD-10-CM | POA: Diagnosis not present

## 2023-01-01 DIAGNOSIS — Z833 Family history of diabetes mellitus: Secondary | ICD-10-CM | POA: Diagnosis not present

## 2023-01-01 DIAGNOSIS — E2839 Other primary ovarian failure: Secondary | ICD-10-CM | POA: Diagnosis not present

## 2023-01-01 DIAGNOSIS — Z13220 Encounter for screening for lipoid disorders: Secondary | ICD-10-CM | POA: Diagnosis not present

## 2023-01-01 DIAGNOSIS — Z0000 Encounter for general adult medical examination without abnormal findings: Secondary | ICD-10-CM | POA: Diagnosis not present

## 2023-02-02 DIAGNOSIS — Z1212 Encounter for screening for malignant neoplasm of rectum: Secondary | ICD-10-CM | POA: Diagnosis not present

## 2023-02-02 DIAGNOSIS — Z1211 Encounter for screening for malignant neoplasm of colon: Secondary | ICD-10-CM | POA: Diagnosis not present

## 2023-02-19 DIAGNOSIS — M25511 Pain in right shoulder: Secondary | ICD-10-CM | POA: Diagnosis not present

## 2023-02-19 DIAGNOSIS — M7912 Myalgia of auxiliary muscles, head and neck: Secondary | ICD-10-CM | POA: Diagnosis not present

## 2024-01-06 DIAGNOSIS — M8580 Other specified disorders of bone density and structure, unspecified site: Secondary | ICD-10-CM | POA: Diagnosis not present

## 2024-01-06 DIAGNOSIS — Z23 Encounter for immunization: Secondary | ICD-10-CM | POA: Diagnosis not present

## 2024-01-06 DIAGNOSIS — R7303 Prediabetes: Secondary | ICD-10-CM | POA: Diagnosis not present

## 2024-01-06 DIAGNOSIS — Z1231 Encounter for screening mammogram for malignant neoplasm of breast: Secondary | ICD-10-CM | POA: Diagnosis not present

## 2024-01-06 DIAGNOSIS — Z0000 Encounter for general adult medical examination without abnormal findings: Secondary | ICD-10-CM | POA: Diagnosis not present

## 2024-01-06 DIAGNOSIS — F320 Major depressive disorder, single episode, mild: Secondary | ICD-10-CM | POA: Diagnosis not present

## 2024-01-14 ENCOUNTER — Other Ambulatory Visit (HOSPITAL_BASED_OUTPATIENT_CLINIC_OR_DEPARTMENT_OTHER): Payer: Self-pay | Admitting: Family Medicine

## 2024-01-14 DIAGNOSIS — Z8249 Family history of ischemic heart disease and other diseases of the circulatory system: Secondary | ICD-10-CM

## 2024-02-04 ENCOUNTER — Other Ambulatory Visit (HOSPITAL_BASED_OUTPATIENT_CLINIC_OR_DEPARTMENT_OTHER)

## 2024-02-04 ENCOUNTER — Encounter (HOSPITAL_BASED_OUTPATIENT_CLINIC_OR_DEPARTMENT_OTHER): Payer: Self-pay

## 2024-03-26 ENCOUNTER — Ambulatory Visit (HOSPITAL_BASED_OUTPATIENT_CLINIC_OR_DEPARTMENT_OTHER)
Admission: RE | Admit: 2024-03-26 | Discharge: 2024-03-26 | Disposition: A | Payer: Self-pay | Source: Ambulatory Visit | Attending: Family Medicine | Admitting: Family Medicine

## 2024-03-26 DIAGNOSIS — Z8249 Family history of ischemic heart disease and other diseases of the circulatory system: Secondary | ICD-10-CM | POA: Insufficient documentation
